# Patient Record
Sex: Female | Born: 2005 | Race: White | Hispanic: No | Marital: Single
Health system: Southern US, Community
[De-identification: ages and names within clinical notes are randomized; demographics above are authoritative.]

---
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
## 2024-01-26 DIAGNOSIS — B070 Plantar wart: Secondary | ICD-10-CM | POA: Diagnosis not present

## 2024-01-26 DIAGNOSIS — L720 Epidermal cyst: Secondary | ICD-10-CM | POA: Diagnosis not present

## 2024-03-01 DIAGNOSIS — K900 Celiac disease: Secondary | ICD-10-CM | POA: Diagnosis not present

## 2024-03-01 DIAGNOSIS — R140 Abdominal distension (gaseous): Secondary | ICD-10-CM | POA: Diagnosis not present
